# Patient Record
Sex: Male | Born: 1980 | Race: White | Hispanic: No | Marital: Single | Smoking: Former smoker
Health system: Southern US, Community
[De-identification: ages and names within clinical notes are randomized; demographics above are authoritative.]

## PROBLEM LIST (undated history)

## (undated) DIAGNOSIS — F419 Anxiety disorder, unspecified: Secondary | ICD-10-CM

## (undated) DIAGNOSIS — F32A Depression, unspecified: Secondary | ICD-10-CM

## (undated) DIAGNOSIS — F329 Major depressive disorder, single episode, unspecified: Secondary | ICD-10-CM

## (undated) DIAGNOSIS — K219 Gastro-esophageal reflux disease without esophagitis: Secondary | ICD-10-CM

## (undated) HISTORY — DX: Major depressive disorder, single episode, unspecified: F32.9

## (undated) HISTORY — DX: Gastro-esophageal reflux disease without esophagitis: K21.9

## (undated) HISTORY — DX: Anxiety disorder, unspecified: F41.9

## (undated) HISTORY — PX: WRIST SURGERY: SHX841

## (undated) HISTORY — DX: Depression, unspecified: F32.A

---
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
## 2002-09-28 ENCOUNTER — Emergency Department (HOSPITAL_COMMUNITY): Admission: EM | Admit: 2002-09-28 | Discharge: 2002-09-28 | Payer: Self-pay | Admitting: Emergency Medicine

## 2002-12-15 ENCOUNTER — Encounter: Payer: Self-pay | Admitting: Emergency Medicine

## 2002-12-15 ENCOUNTER — Emergency Department (HOSPITAL_COMMUNITY): Admission: EM | Admit: 2002-12-15 | Discharge: 2002-12-15 | Payer: Self-pay | Admitting: Emergency Medicine

## 2004-01-19 ENCOUNTER — Emergency Department (HOSPITAL_COMMUNITY): Admission: AC | Admit: 2004-01-19 | Discharge: 2004-01-19 | Payer: Self-pay

## 2005-10-25 ENCOUNTER — Emergency Department (HOSPITAL_COMMUNITY): Admission: EM | Admit: 2005-10-25 | Discharge: 2005-10-25 | Payer: Self-pay | Admitting: Emergency Medicine

## 2008-02-02 ENCOUNTER — Emergency Department (HOSPITAL_COMMUNITY): Admission: EM | Admit: 2008-02-02 | Discharge: 2008-02-02 | Payer: Self-pay | Admitting: Emergency Medicine

## 2008-11-12 ENCOUNTER — Emergency Department (HOSPITAL_COMMUNITY): Admission: EM | Admit: 2008-11-12 | Discharge: 2008-11-12 | Payer: Self-pay | Admitting: Emergency Medicine

## 2010-03-07 ENCOUNTER — Emergency Department: Payer: Self-pay | Admitting: Emergency Medicine

## 2011-04-12 ENCOUNTER — Other Ambulatory Visit (HOSPITAL_COMMUNITY): Payer: Self-pay | Admitting: Orthopedic Surgery

## 2011-04-12 DIAGNOSIS — M545 Low back pain, unspecified: Secondary | ICD-10-CM

## 2011-04-13 ENCOUNTER — Other Ambulatory Visit (HOSPITAL_COMMUNITY): Payer: Self-pay | Admitting: Orthopedic Surgery

## 2011-04-13 ENCOUNTER — Ambulatory Visit (HOSPITAL_COMMUNITY)
Admission: RE | Admit: 2011-04-13 | Discharge: 2011-04-13 | Disposition: A | Discharge status: Home / Self Care | Payer: Self-pay | Source: Ambulatory Visit | Attending: Orthopedic Surgery | Admitting: Orthopedic Surgery

## 2011-04-13 DIAGNOSIS — M5146 Schmorl's nodes, lumbar region: Secondary | ICD-10-CM | POA: Insufficient documentation

## 2011-04-13 DIAGNOSIS — M545 Low back pain, unspecified: Secondary | ICD-10-CM | POA: Insufficient documentation

## 2011-04-13 DIAGNOSIS — M79609 Pain in unspecified limb: Secondary | ICD-10-CM | POA: Insufficient documentation

## 2011-04-13 DIAGNOSIS — M519 Unspecified thoracic, thoracolumbar and lumbosacral intervertebral disc disorder: Secondary | ICD-10-CM | POA: Insufficient documentation

## 2011-04-13 DIAGNOSIS — R209 Unspecified disturbances of skin sensation: Secondary | ICD-10-CM | POA: Insufficient documentation

## 2011-05-10 ENCOUNTER — Other Ambulatory Visit (HOSPITAL_COMMUNITY): Payer: Self-pay | Admitting: Orthopedic Surgery

## 2011-05-15 ENCOUNTER — Ambulatory Visit (HOSPITAL_COMMUNITY)
Admission: RE | Admit: 2011-05-15 | Discharge: 2011-05-15 | Disposition: A | Discharge status: Home / Self Care | Payer: Self-pay | Source: Ambulatory Visit | Attending: Orthopedic Surgery | Admitting: Orthopedic Surgery

## 2011-05-15 DIAGNOSIS — R609 Edema, unspecified: Secondary | ICD-10-CM | POA: Insufficient documentation

## 2011-05-15 DIAGNOSIS — R209 Unspecified disturbances of skin sensation: Secondary | ICD-10-CM | POA: Insufficient documentation

## 2011-05-15 DIAGNOSIS — M79609 Pain in unspecified limb: Secondary | ICD-10-CM | POA: Insufficient documentation

## 2011-05-15 MED ORDER — GADOBENATE DIMEGLUMINE 529 MG/ML IV SOLN
15.0000 mL | Freq: Once | INTRAVENOUS | Status: AC
  Administered 2011-05-15: 15 mL via INTRAVENOUS

## 2011-09-26 LAB — POCT I-STAT, CHEM 8
Chloride: 105
Creatinine, Ser: 1.3
Glucose, Bld: 127 — ABNORMAL HIGH
HCT: 48
Sodium: 143
TCO2: 26

## 2011-09-26 LAB — ETHANOL: Alcohol, Ethyl (B): 205 — ABNORMAL HIGH

## 2014-12-30 ENCOUNTER — Emergency Department (HOSPITAL_COMMUNITY): Payer: 59

## 2014-12-30 ENCOUNTER — Encounter (HOSPITAL_COMMUNITY): Payer: Self-pay | Admitting: Emergency Medicine

## 2014-12-30 ENCOUNTER — Emergency Department (HOSPITAL_COMMUNITY)
Admission: EM | Admit: 2014-12-30 | Discharge: 2014-12-31 | Disposition: A | Discharge status: Home / Self Care | Payer: 59 | Attending: Emergency Medicine | Admitting: Emergency Medicine

## 2014-12-30 DIAGNOSIS — R05 Cough: Secondary | ICD-10-CM | POA: Diagnosis present

## 2014-12-30 DIAGNOSIS — J069 Acute upper respiratory infection, unspecified: Secondary | ICD-10-CM | POA: Insufficient documentation

## 2014-12-30 DIAGNOSIS — R059 Cough, unspecified: Secondary | ICD-10-CM

## 2014-12-30 DIAGNOSIS — R058 Other specified cough: Secondary | ICD-10-CM

## 2014-12-30 NOTE — ED Provider Notes (Signed)
CSN: [id number]     Arrival date & time 12/30/14  2225 History   First MD Initiated Contact with Patient 12/30/14 2314     Chief Complaint  Patient presents with  . Cough  . Nasal Congestion     (Consider location/radiation/quality/duration/timing/severity/associated sxs/prior Treatment) HPI  34 year old male who presents with URI symptoms. Patient states for the past 4 days he developed onset of nasal congestion runny nose, nonproductive cough and postnasal drips. He was seen at urgent care today for the same complaint and was prescribed antibiotic. He went home and report coughing up some blood streak fleghm.  The blood concerns him, prompting him to come to the ER for further evaluation. She states that he felt the blood was, from his nasal passage and does not think that he coughed up. States that his cough has been nonproductive . No report of fever or chills. No prior history of PE DVT, no recent surgery, prolonged bed rest, unilateral leg swelling. Patient is nonsmoker. No chest pain or shortness of breath.      History reviewed. No pertinent past medical history. Past Surgical History  Procedure Laterality Date  . Wrist surgery     No family history on file. History  Substance Use Topics  . Smoking status: Never Smoker   . Smokeless tobacco: Not on file  . Alcohol Use: Yes    Review of Systems  All other systems reviewed and are negative.     Allergies  Review of patient's allergies indicates no known allergies.  Home Medications   Prior to Admission medications   Not on File   BP 127/83 mmHg  Pulse 78  Temp(Src) 98.4 F (36.9 C) (Oral)  Resp 17  Ht  (1.753 m)  Wt 150 lb (68.04 kg)  BMI 22.14 kg/m2  SpO2 98% Physical Exam  Constitutional: He is oriented to person, place, and time. He appears well-developed and well-nourished. No distress.  HENT:  Head: Atraumatic.  Left TM is mildly erythematous right TM with normal cone of light and  clear  Nose: Left nares with trace of blood, primary is mildly erythematous  Throat: Uvula is midline, postnasal drip noted. No trismus. No tonsillar enlargement or exudates.  Eyes: Conjunctivae are normal.  Neck: Normal range of motion. Neck supple.  Cardiovascular: Normal rate and regular rhythm.   Pulmonary/Chest: Effort normal and breath sounds normal. He has no rales.  Abdominal: Soft. There is no tenderness.  Musculoskeletal: He exhibits no edema.  Lymphadenopathy:    He has no cervical adenopathy.  Neurological: He is alert and oriented to person, place, and time.  Skin: No rash noted.  Psychiatric: He has a normal mood and affect.    ED Course  Procedures (including critical care time)  Patient here with URI symptoms and postnasal drips with trace of blood likely coming from nasal passage. Chest x-ray without evidence of pneumonia. He has no significant risk factors for PE. Reassurance given. Recommend avoid taking antibiotic as this is likely to be a viral infection.  Labs Review Labs Reviewed - No data to display  Imaging Review Dg Chest 2 View  12/30/2014   CLINICAL DATA:  Acute onset of dry cough with blood in phlegm. Initial encounter.  EXAM: CHEST  2 VIEW  COMPARISON:  Chest radiograph performed 11/12/2008  FINDINGS: The lungs are well-aerated and clear. There is no evidence of focal opacification, pleural effusion or pneumothorax.  The heart is borderline normal in size; the mediastinal contour is within  normal limits. No acute osseous abnormalities are seen.  IMPRESSION: No acute cardiopulmonary process seen.   Electronically Signed   By: Roanna RaiderJeffery  Chang M.D.   On: 12/30/2014 23:46     EKG Interpretation None      MDM   Final diagnoses:  URI (upper respiratory infection)    BP 127/83 mmHg  Pulse 78  Temp(Src) 98.4 F (36.9 C) (Oral)  Resp 17  Ht 5\' 9"  (1.753 m)  Wt 150 lb (68.04 kg)  BMI 22.14 kg/m2  SpO2 98%  I have reviewed nursing notes and vital  signs. I personally reviewed the imaging tests through PACS system  I reviewed available ER/hospitalization records thought the EMR     Fayrene HelperBowie Adisyn Ruscitti, PA-C 12/31/14 0001  Richardean Canalavid H Yao, MD 12/31/14 [phone]

## 2014-12-30 NOTE — ED Notes (Addendum)
Pt. reports persistent productive cough with bloody phlegm , nasal congestion / runny nose/ post nasal drip  for several days seen  at an urgent care today , prescribed with oral antibiotic for URI .

## 2014-12-31 NOTE — Discharge Instructions (Signed)
Upper Respiratory Infection, Adult An upper respiratory infection (URI) is also sometimes known as the common cold. The upper respiratory tract includes the nose, sinuses, throat, trachea, and bronchi. Bronchi are the airways leading to the lungs. Most people improve within 1 week, but symptoms can last up to 2 weeks. A residual cough may last even longer.  CAUSES Many different viruses can infect the tissues lining the upper respiratory tract. The tissues become irritated and inflamed and often become very moist. Mucus production is also common. A cold is contagious. You can easily spread the virus to others by oral contact. This includes kissing, sharing a glass, coughing, or sneezing. Touching your mouth or nose and then touching a surface, which is then touched by another person, can also spread the virus. SYMPTOMS  Symptoms typically develop 1 to 3 days after you come in contact with a cold virus. Symptoms vary from person to person. They may include:  Runny nose.  Sneezing.  Nasal congestion.  Sinus irritation.  Sore throat.  Loss of voice (laryngitis).  Cough.  Fatigue.  Muscle aches.  Loss of appetite.  Headache.  Low-grade fever. DIAGNOSIS  You might diagnose your own cold based on familiar symptoms, since most people get a cold 2 to 3 times a year. Your caregiver can confirm this based on your exam. Most importantly, your caregiver can check that your symptoms are not due to another disease such as strep throat, sinusitis, pneumonia, asthma, or epiglottitis. Blood tests, throat tests, and X-rays are not necessary to diagnose a common cold, but they may sometimes be helpful in excluding other more serious diseases. Your caregiver will decide if any further tests are required. RISKS AND COMPLICATIONS  You may be at risk for a more severe case of the common cold if you smoke cigarettes, have chronic heart disease (such as heart failure) or lung disease (such as asthma), or if  you have a weakened immune system. The very young and very old are also at risk for more serious infections. Bacterial sinusitis, middle ear infections, and bacterial pneumonia can complicate the common cold. The common cold can worsen asthma and chronic obstructive pulmonary disease (COPD). Sometimes, these complications can require emergency medical care and may be life-threatening. PREVENTION  The best way to protect against getting a cold is to practice good hygiene. Avoid oral or hand contact with people with cold symptoms. Wash your hands often if contact occurs. There is no clear evidence that vitamin C, vitamin E, echinacea, or exercise reduces the chance of developing a cold. However, it is always recommended to get plenty of rest and practice good nutrition. TREATMENT  Treatment is directed at relieving symptoms. There is no cure. Antibiotics are not effective, because the infection is caused by a virus, not by bacteria. Treatment may include:  Increased fluid intake. Sports drinks offer valuable electrolytes, sugars, and fluids.  Breathing heated mist or steam (vaporizer or shower).  Eating chicken soup or other clear broths, and maintaining good nutrition.  Getting plenty of rest.  Using gargles or lozenges for comfort.  Controlling fevers with ibuprofen or acetaminophen as directed by your caregiver.  Increasing usage of your inhaler if you have asthma. Zinc gel and zinc lozenges, taken in the first 24 hours of the common cold, can shorten the duration and lessen the severity of symptoms. Pain medicines may help with fever, muscle aches, and throat pain. A variety of non-prescription medicines are available to treat congestion and runny nose. Your caregiver   can make recommendations and may suggest nasal or lung inhalers for other symptoms.  HOME CARE INSTRUCTIONS   Only take over-the-counter or prescription medicines for pain, discomfort, or fever as directed by your  caregiver.  Use a warm mist humidifier or inhale steam from a shower to increase air moisture. This may keep secretions moist and make it easier to breathe.  Drink enough water and fluids to keep your urine clear or pale yellow.  Rest as needed.  Return to work when your temperature has returned to normal or as your caregiver advises. You may need to stay home longer to avoid infecting others. You can also use a face mask and careful hand washing to prevent spread of the virus. SEEK MEDICAL CARE IF:   After the first few days, you feel you are getting worse rather than better.  You need your caregiver's advice about medicines to control symptoms.  You develop chills, worsening shortness of breath, or brown or red sputum. These may be signs of pneumonia.  You develop yellow or brown nasal discharge or pain in the face, especially when you bend forward. These may be signs of sinusitis.  You develop a fever, swollen neck glands, pain with swallowing, or white areas in the back of your throat. These may be signs of strep throat. SEEK IMMEDIATE MEDICAL CARE IF:   You have a fever.  You develop severe or persistent headache, ear pain, sinus pain, or chest pain.  You develop wheezing, a prolonged cough, cough up blood, or have a change in your usual mucus (if you have chronic lung disease).  You develop sore muscles or a stiff neck. Document Released: 06/05/2001 Document Revised: 03/03/2012 Document Reviewed: 03/17/2014 ExitCare Patient Information 2015 [employer]. This information is not intended to replace advice given to you by your health care provider. Make sure you discuss any questions you have with your health care provider.  

## 2015-03-21 ENCOUNTER — Encounter: Payer: Self-pay | Admitting: Family Medicine

## 2015-03-21 ENCOUNTER — Ambulatory Visit (INDEPENDENT_AMBULATORY_CARE_PROVIDER_SITE_OTHER): Payer: 59 | Admitting: Family Medicine

## 2015-03-21 VITALS — BP 112/70 | HR 76 | Temp 98.4°F | Resp 16 | Ht 66.5 in | Wt 148.2 lb

## 2015-03-21 DIAGNOSIS — K219 Gastro-esophageal reflux disease without esophagitis: Secondary | ICD-10-CM | POA: Diagnosis not present

## 2015-03-21 DIAGNOSIS — Z23 Encounter for immunization: Secondary | ICD-10-CM

## 2015-03-21 DIAGNOSIS — G4700 Insomnia, unspecified: Secondary | ICD-10-CM

## 2015-03-21 DIAGNOSIS — F418 Other specified anxiety disorders: Secondary | ICD-10-CM | POA: Diagnosis not present

## 2015-03-21 DIAGNOSIS — F419 Anxiety disorder, unspecified: Principal | ICD-10-CM

## 2015-03-21 DIAGNOSIS — F329 Major depressive disorder, single episode, unspecified: Secondary | ICD-10-CM

## 2015-03-21 MED ORDER — TRAZODONE HCL 50 MG PO TABS: 50.0000 mg | ORAL_TABLET | Freq: Every evening | ORAL | Status: DC | PRN

## 2015-03-21 MED ORDER — SERTRALINE HCL 50 MG PO TABS: 50.0000 mg | ORAL_TABLET | Freq: Every day | ORAL | Status: DC

## 2015-03-21 NOTE — Progress Notes (Signed)
Subjective:    Patient ID: Robert Wang, male    DOB: [birth year], 34 y.o.   MRN: [id number]  03/21/2015  Establish Care; Anxiety; and Depression   HPI This 34 y.o. male presents to establish care.  Last physical:  never Colonoscopy:  never TDAP:  Within seven years. 2012. Influenza:  Never; one year old at home. Eye exam: 20 years ago. Dental exam:  Today. First time in 20 years.   Depression and anxiety:  Onset three years ago.  Not doing well enough. Work is high stress.  Wants to scream at every single customer.  Trying to keep calm is really difficult.  Not enough money.  Girlfriend will ask for pizza; not enough money for pizza.  Having difficulties with dog food and formula.  Girlfriend does not work.  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  Lots of phone calls during the day.  Has blocked out windows.  No SI/HI.  No thoughts of hurting child.  No previous treatment for anxiety or depression.  No diagnosed or treated depression and anxiety.  Has tried Nyquil with cold; tried PM medications but does not want to be dependent upon medications.  No ZQuil.  Does not want anything to do with Xanax; has friend on Valium that worked great for him.  Goes to bed at 10:00am; wakes up at 2:00pm to feed son.  Falls back asleep 3:30pm until 9:30.    Headaches and teeth pain: takes Ibuprofen PRN.  Having teeth extraction next month.  [place] in HS.  GERD: taking Zantac  bid.  Denies n/v/d/c; denies abdominal pain.  Denies bloody stools or melena.    Review of Systems  Constitutional: Negative for fever, chills, diaphoresis, activity change, appetite change and fatigue.  HENT: Positive for dental problem.   Eyes: Negative for visual disturbance.  Respiratory: Negative for cough and shortness of breath.   Cardiovascular: Negative for chest pain, palpitations and leg swelling.  Gastrointestinal: Negative for nausea, vomiting, abdominal pain, diarrhea, constipation, blood in  stool and rectal pain.  Endocrine: Negative for cold intolerance, heat intolerance, polydipsia, polyphagia and polyuria.  Neurological: Negative for dizziness, tremors, seizures, syncope, facial asymmetry, speech difficulty, weakness, light-headedness, numbness and headaches.  Psychiatric/Behavioral: Positive for sleep disturbance and dysphoric mood. Negative for suicidal ideas and self-injury. The patient is nervous/anxious.     History reviewed. No pertinent past medical history. Past Surgical History  Procedure Laterality Date  . Wrist surgery      34 YEARS OLD   No Known Allergies Current Outpatient Prescriptions  Medication Sig Dispense Refill  . IBUPROFEN PO Take by mouth daily.    . Ranitidine HCl (ZANTAC PO) Take by mouth daily.    . sertraline (ZOLOFT) 50 MG tablet Take 1 tablet (50 mg total) by mouth daily. 30 tablet 3  . traZODone (DESYREL) 50 MG tablet Take 1-2 tablets (50-100 mg total) by mouth at bedtime as needed for sleep. 60 tablet 5   No current facility-administered medications for this visit.   History   Social History  . Marital Status: Single    Spouse Name: N/A  . Number of Children: N/A  . Years of Education: N/A   Occupational History  . [employer]    Social History Main Topics  . Smoking status: Former Smoker -- 1.50 packs/day for 15 years    Types: Cigarettes  . Smokeless tobacco: Not on file  . Alcohol Use: 0.0 oz/week    0 Standard drinks or equivalent  per week     Comment: beer - one or twice a month  . Drug Use: No  . Sexual Activity: Not on file   Other Topics Concern  . 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      Tobacco: Vaping      Alcohol:  0-1 drinks per week.      Drugs:  None      Exercise: none   Family History  Problem Relation Age of Onset  . Heart disease Mother 30     HEART ATTACK; CABG age 87.  Marland Kitchen Hypertension Father         Objective:    BP 112/70 mmHg  Pulse 76  Temp(Src) 98.4 F (36.9 C) (Oral)  Resp 16  Ht 5' 6.5" (1.689 m)  Wt 148 lb 3.2 oz (67.223 kg)  BMI 23.56 kg/m2  SpO2 98% Physical Exam  Constitutional: He is oriented to person, place, and time. He appears well-developed and well-nourished. No distress.  HENT:  Head: Normocephalic and atraumatic.  Right Ear: External ear normal.  Left Ear: External ear normal.  Nose: Nose normal.  Mouth/Throat: Oropharynx is clear and moist. Dental caries present.  Eyes: Conjunctivae and EOM are normal. Pupils are equal, round, and reactive to light.  Neck: Normal range of motion. Neck supple. Carotid bruit is not present. No thyromegaly present.  Cardiovascular: Normal rate, regular rhythm, normal heart sounds and intact distal pulses.  Exam reveals no gallop and no friction rub.   No murmur heard. Pulmonary/Chest: Effort normal and breath sounds normal. He has no wheezes. He has no rales.  Abdominal: Soft. Bowel sounds are normal. He exhibits no distension and no mass. There is no tenderness. There is no rebound and no guarding.  Lymphadenopathy:    He has no cervical adenopathy.  Neurological: He is alert and oriented to person, place, and time. No cranial nerve deficit. He exhibits normal muscle tone. Coordination normal.  Skin: Skin is warm and dry. No rash noted. He is not diaphoretic.  Psychiatric: He has a normal mood and affect. His behavior is normal. Judgment and thought content normal.  Nursing note and vitals reviewed.   INFLUENZA VACCINE ADMINISTERED.    Assessment & Plan:   1. Anxiety and depression   2. Insomnia   3. Gastroesophageal reflux disease without esophagitis   4. Need for prophylactic vaccination and inoculation against influenza     1. Anxiety and depression: New.  Secondary to work and personal stressors; discussed treatment options in detail with patient.   Declined psychotherapy at this time; not interested in starting exercise program. Rx for Zoloft  one tablet daily provided.  Limit caffeine intake. 2.  Insomnia:  New.  Rx for Trazodone  provided.  Recommend dedicating 6 hours minimum to sleep without having responsibility for child.   3. GERD: controlled with Zantac. 4.  S/p flu vaccine.   Meds ordered this encounter  Medications  . Ranitidine HCl (ZANTAC PO)    Sig: Take by mouth daily.  . IBUPROFEN PO    Sig: Take by mouth daily.  . sertraline (ZOLOFT) 50 MG tablet    Sig: Take 1 tablet (50 mg total) by mouth daily.    Dispense:  30 tablet  Refill:  3  . traZODone (DESYREL) 50 MG tablet    Sig: Take 1-2 tablets (50-100 mg total) by mouth at bedtime as needed for sleep.    Dispense:  60 tablet    Refill:  5    Return in about 6 weeks (around 05/02/2015) for recheck anxiety and insomnia.    Nisa Decaire Paulita FujitaMartin Olander Friedl, M.D. [facility]  [place] [address] [place], [place]  [id number] [phone] phone [phone] fax

## 2015-03-21 NOTE — Patient Instructions (Signed)
Generalized Anxiety Disorder Generalized anxiety disorder (GAD) is a mental disorder. It interferes with life functions, including relationships, work, and school. GAD is different from normal anxiety, which everyone experiences at some point in their lives in response to specific life events and activities. Normal anxiety actually helps us prepare for and get through these life events and activities. Normal anxiety goes away after the event or activity is over.  GAD causes anxiety that is not necessarily related to specific events or activities. It also causes excess anxiety in proportion to specific events or activities. The anxiety associated with GAD is also difficult to control. GAD can vary from mild to severe. People with severe GAD can have intense waves of anxiety with physical symptoms (panic attacks).  SYMPTOMS The anxiety and worry associated with GAD are difficult to control. This anxiety and worry are related to many life events and activities and also occur more days than not for 6 months or longer. People with GAD also have three or more of the following symptoms (one or more in children):  Restlessness.   Fatigue.  Difficulty concentrating.   Irritability.  Muscle tension.  Difficulty sleeping or unsatisfying sleep. DIAGNOSIS GAD is diagnosed through an assessment by your health care provider. Your health care provider will ask you questions aboutyour mood,physical symptoms, and events in your life. Your health care provider may ask you about your medical history and use of alcohol or drugs, including prescription medicines. Your health care provider may also do a physical exam and blood tests. Certain medical conditions and the use of certain substances can cause symptoms similar to those associated with GAD. Your health care provider may refer you to a mental health specialist for further evaluation. TREATMENT The following therapies are usually used to treat GAD:    Medication. Antidepressant medication usually is prescribed for long-term daily control. Antianxiety medicines may be added in severe cases, especially when panic attacks occur.   Talk therapy (psychotherapy). Certain types of talk therapy can be helpful in treating GAD by providing support, education, and guidance. A form of talk therapy called cognitive behavioral therapy can teach you healthy ways to think about and react to daily life events and activities.  Stress managementtechniques. These include yoga, meditation, and exercise and can be very helpful when they are practiced regularly. A mental health specialist can help determine which treatment is best for you. Some people see improvement with one therapy. However, other people require a combination of therapies. Document Released: 04/06/2013 Document Revised: 04/26/2014 Document Reviewed: 04/06/2013 ExitCare Patient Information 2015 [employer]. This information is not intended to replace advice given to you by your health care provider. Make sure you discuss any questions you have with your health care provider.  

## 2015-03-25 ENCOUNTER — Telehealth: Payer: Self-pay

## 2015-03-25 NOTE — Telephone Encounter (Signed)
Call --- frequently side effects will improve within 2 weeks of taking medication. Are side effects tolerable; can he stick it out for two weeks?  Is he taking 1/2 tablet daily at this time?

## 2015-03-25 NOTE — Telephone Encounter (Signed)
Pt was put on sertraline (ZOLOFT) 50 MG tablet [[id number]][[id number]] and traZODone (DESYREL) 50 MG tablet [[id number]][[id number]]  By Dr. Katrinka BlazingSmith. Patient states that he is having sexual and sinus side affects, and would like to know if Dr. Katrinka BlazingSmith could adjust these medications for him.

## 2015-03-25 NOTE — Telephone Encounter (Signed)
Spoke with pt, he is taking the half pill of Zoloft. He also states he cannot make a bowel movement. He feels uncomfortable during the day and he states he cannot take this medication any longer because not having an orgasm is a deal breaker.for him. He thinks the Trazadone is messing with his sinuses. Can we switch him to something else. Pt does not want to try for 2 weeks. Please advise.

## 2015-03-25 NOTE — Telephone Encounter (Signed)
Dr. Katrinka BlazingSmith, should pt come in to discuss?

## 2015-03-25 NOTE — Telephone Encounter (Signed)
Left message for pt to call back  °

## 2015-03-26 MED ORDER — CITALOPRAM HYDROBROMIDE 20 MG PO TABS: 20.0000 mg | ORAL_TABLET | Freq: Every day | ORAL | Status: DC

## 2015-03-26 NOTE — Telephone Encounter (Signed)
Gave pt message.

## 2015-03-26 NOTE — Telephone Encounter (Signed)
Call---1. OK to stop Sertraline/Zoloft and Trazodone at this time.  2.  I have sent in Citalopram/Celexa 20mg  one tablet daily; this is for anxiety.  3.  Recommend he take Benadryl 1-2 tablets at bedtime.

## 2015-04-07 ENCOUNTER — Telehealth: Payer: Self-pay

## 2015-04-07 NOTE — Telephone Encounter (Signed)
Patient wants to speak with a Doctor about getting a prescription for heart burn. Please call! [phone]

## 2015-04-07 NOTE — Telephone Encounter (Signed)
Pt is on Zantac, maybe this is not working anymore.

## 2015-04-11 NOTE — Telephone Encounter (Signed)
Tried to call pt times 3 unable to leave voicemail.

## 2015-04-13 NOTE — Telephone Encounter (Signed)
Left message for pt to call back  °

## 2015-04-20 ENCOUNTER — Ambulatory Visit (INDEPENDENT_AMBULATORY_CARE_PROVIDER_SITE_OTHER): Payer: 59 | Admitting: Family Medicine

## 2015-04-20 ENCOUNTER — Encounter: Payer: Self-pay | Admitting: Family Medicine

## 2015-04-20 VITALS — BP 112/71 | HR 55 | Temp 98.2°F | Resp 16 | Ht 67.0 in | Wt 147.0 lb

## 2015-04-20 DIAGNOSIS — K219 Gastro-esophageal reflux disease without esophagitis: Secondary | ICD-10-CM | POA: Diagnosis not present

## 2015-04-20 DIAGNOSIS — F418 Other specified anxiety disorders: Secondary | ICD-10-CM | POA: Diagnosis not present

## 2015-04-20 DIAGNOSIS — F329 Major depressive disorder, single episode, unspecified: Secondary | ICD-10-CM

## 2015-04-20 DIAGNOSIS — G4700 Insomnia, unspecified: Secondary | ICD-10-CM

## 2015-04-20 DIAGNOSIS — F419 Anxiety disorder, unspecified: Principal | ICD-10-CM

## 2015-04-20 LAB — CBC WITH DIFFERENTIAL/PLATELET
Basophils Absolute: 0 10*3/uL (ref 0.0–0.1)
Basophils Relative: 0 % (ref 0–1)
EOS ABS: 0.1 10*3/uL (ref 0.0–0.7)
EOS PCT: 1 % (ref 0–5)
HEMATOCRIT: 41.7 % (ref 39.0–52.0)
Hemoglobin: 14.6 g/dL (ref 13.0–17.0)
LYMPHS PCT: 36 % (ref 12–46)
Lymphs Abs: 2.5 10*3/uL (ref 0.7–4.0)
MCH: 29.9 pg (ref 26.0–34.0)
MCHC: 35 g/dL (ref 30.0–36.0)
MCV: 85.5 fL (ref 78.0–100.0)
MONO ABS: 0.6 10*3/uL (ref 0.1–1.0)
MONOS PCT: 8 % (ref 3–12)
MPV: 10.1 fL (ref 8.6–12.4)
NEUTROS ABS: 3.8 10*3/uL (ref 1.7–7.7)
Neutrophils Relative %: 55 % (ref 43–77)
Platelets: 276 10*3/uL (ref 150–400)
RBC: 4.88 MIL/uL (ref 4.22–5.81)
RDW: 13.3 % (ref 11.5–15.5)
WBC: 6.9 10*3/uL (ref 4.0–10.5)

## 2015-04-20 LAB — COMPREHENSIVE METABOLIC PANEL
ALBUMIN: 4.6 g/dL (ref 3.5–5.2)
ALK PHOS: 61 U/L (ref 39–117)
ALT: 122 U/L — AB (ref 0–53)
AST: 40 U/L — AB (ref 0–37)
BUN: 16 mg/dL (ref 6–23)
CO2: 29 mEq/L (ref 19–32)
Calcium: 9.8 mg/dL (ref 8.4–10.5)
Chloride: 103 mEq/L (ref 96–112)
Creat: 0.97 mg/dL (ref 0.50–1.35)
Glucose, Bld: 98 mg/dL (ref 70–99)
POTASSIUM: 4 meq/L (ref 3.5–5.3)
Sodium: 138 mEq/L (ref 135–145)
TOTAL PROTEIN: 7.3 g/dL (ref 6.0–8.3)
Total Bilirubin: 0.5 mg/dL (ref 0.2–1.2)

## 2015-04-20 LAB — TSH: TSH: 1.923 u[IU]/mL (ref 0.350–4.500)

## 2015-04-20 MED ORDER — TRAZODONE HCL 100 MG PO TABS: 100.0000 mg | ORAL_TABLET | Freq: Every evening | ORAL | Status: DC | PRN

## 2015-04-20 MED ORDER — PANTOPRAZOLE SODIUM 40 MG PO TBEC: 40.0000 mg | DELAYED_RELEASE_TABLET | Freq: Every day | ORAL | Status: DC

## 2015-04-20 MED ORDER — SERTRALINE HCL 100 MG PO TABS: 100.0000 mg | ORAL_TABLET | Freq: Every day | ORAL | Status: DC

## 2015-04-20 NOTE — Progress Notes (Signed)
Subjective:    Patient ID: Robert Wang, male    DOB: 04/27/1981, 34 y.o.   MRN: [id number]  04/20/2015  Heartburn; Medication Management; and Medication Refill   HPI This 34 y.o. male presents for four week follow-up of the following:  1. Anxiety and depression: management changes made at last visit included adding Zoloft 50mg  daily.  No improvement in mood.  Never stopped Zoloft; picked up Celexa but has not started it yet.  Zoloft side effects delayed orgasm every time; still can reach orgasm but difficult; no improvement since starting.  Other side effects is constipation which is less severe; tolerable.  Anxiety around people.  Feeling like people staring at them or laughing. Seems worse on medication.  Now has 2 cars.  Working more now.  Working 54 hours per week.  Was also taking hydrocodone when started Zoloft; now that off of hydrocodone.   Has only been a few days.   No hallucinations.  2. Insomnia:  Going to bed 10:00am; taking Trazodone at 9:30am; taking 2 Trazodone; taking one hour to take effect; sleeping after 4.5-6 hours.    3. GERD:  Got worse after last visit; had to keep Zantac with patient at all times; if missed dose, symptoms were horrible.  No n/v; no abdominal pain.  Indigestion.  Weird aches in shoulder blade.  None in two weeks; was having weird pains in shoulder blade for days.   Drinking one beer per week.  No bowel movement today.   Review of Systems  Constitutional: Negative for fever, chills, diaphoresis, activity change, appetite change and fatigue.  Eyes: Negative for visual disturbance.  Respiratory: Negative for cough and shortness of breath.   Cardiovascular: Negative for chest pain, palpitations and leg swelling.  Gastrointestinal: Positive for constipation. Negative for nausea, vomiting, abdominal pain, diarrhea, blood in stool, abdominal distention, anal bleeding and rectal pain.  Endocrine: Negative for cold intolerance, heat intolerance,  polydipsia, polyphagia and polyuria.  Neurological: Negative for dizziness, tremors, seizures, syncope, facial asymmetry, speech difficulty, weakness, light-headedness, numbness and headaches.  Psychiatric/Behavioral: Positive for dysphoric mood. Negative for suicidal ideas, sleep disturbance and self-injury. The patient is nervous/anxious.     No past medical history on file. Past Surgical History  Procedure Laterality Date  . Wrist surgery      34 YEARS OLD   No Known Allergies History   Social History  . Marital Status: Single    Spouse Name: N/A  . Number of Children: N/A  . Years of Education: N/A   Occupational History  . [employer]    Social History Main Topics  . Smoking status: Former Smoker -- 1.50 packs/day for 15 years    Types: Cigarettes  . Smokeless tobacco: Not on file  . Alcohol Use: 0.0 oz/week    0 Standard drinks or equivalent per week     Comment: beer - one or twice a month  . Drug Use: No  . Sexual Activity: Not on file   Other Topics Concern  . 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      Tobacco: Vaping      Alcohol:  0-1 drinks per week.      Drugs:  None      Exercise: none  Family History  Problem Relation Age of Onset  . Heart disease Mother 71    HEART ATTACK; CABG age 54.  Marland Kitchen Hypertension Father         Objective:    BP 112/71 mmHg  Pulse 55  Temp(Src) 98.2 F (36.8 C)  Resp 16  Ht  (1.702 m)  Wt 147 lb (66.679 kg)  BMI 23.02 kg/m2  SpO2 98% Physical Exam  Constitutional: He is oriented to person, place, and time. He appears well-developed and well-nourished. No distress.  HENT:  Head: Normocephalic and atraumatic.  Right Ear: External ear normal.  Left Ear: External ear normal.  Nose: Nose normal.  Mouth/Throat: Oropharynx is clear and moist.  Poor dentition    Eyes: Conjunctivae and EOM are normal. Pupils are equal, round, and reactive to light.  Neck: Normal range of motion. Neck supple. Carotid bruit is not present. No thyromegaly present.  Cardiovascular: Normal rate, regular rhythm, normal heart sounds and intact distal pulses.  Exam reveals no gallop and no friction rub.   No murmur heard. Pulmonary/Chest: Effort normal and breath sounds normal. He has no wheezes. He has no rales.  Abdominal: Soft. Bowel sounds are normal. He exhibits no distension and no mass. There is no tenderness. There is no rebound and no guarding.  Lymphadenopathy:    He has no cervical adenopathy.  Neurological: He is alert and oriented to person, place, and time. No cranial nerve deficit.  Skin: Skin is warm and dry. No rash noted. He is not diaphoretic.  Psychiatric: He has a normal mood and affect. His behavior is normal. Judgment and thought content normal.  Nursing note and vitals reviewed.       Assessment & Plan:   1. Anxiety and depression   2. Insomnia   3. Gastroesophageal reflux disease without esophagitis     1. Anxiety and depression: unchanged; increase Zoloft to  daily.  If suffers with worsening side effects, switch to Celexa  daily.   2.  Insomnia: well controlled with Trazodone  before sleep.   3.  GERD: uncontrolled; obtain labs; rx for Protonix provided; continue Zantac  bid PRN.  Dietary modification encouraged and reviewed.   Meds ordered this encounter  Medications  . sertraline (ZOLOFT) 100 MG tablet    Sig: Take 1 tablet (100 mg total) by mouth daily.    Dispense:  30 tablet    Refill:  5  . traZODone (DESYREL) 100 MG tablet    Sig: Take 1 tablet (100 mg total) by mouth at bedtime as needed for sleep.    Dispense:  30 tablet    Refill:  5  . pantoprazole (PROTONIX) 40 MG tablet    Sig: Take 1 tablet (40 mg total) by mouth daily.    Dispense:  30 tablet    Refill:  5    Return in about 6 weeks (around  06/01/2015) for recheck.   Jahaan Vanwagner Paulita Fujita, M.D. [facility] [address] [place], [place]  [address] [phone] phone [phone] fax

## 2015-04-20 NOTE — Patient Instructions (Signed)

## 2015-04-21 ENCOUNTER — Encounter: Payer: Self-pay | Admitting: Family Medicine

## 2015-04-21 DIAGNOSIS — F419 Anxiety disorder, unspecified: Principal | ICD-10-CM

## 2015-04-21 DIAGNOSIS — F329 Major depressive disorder, single episode, unspecified: Secondary | ICD-10-CM | POA: Insufficient documentation

## 2015-04-21 DIAGNOSIS — G4700 Insomnia, unspecified: Secondary | ICD-10-CM | POA: Insufficient documentation

## 2015-04-21 DIAGNOSIS — K219 Gastro-esophageal reflux disease without esophagitis: Secondary | ICD-10-CM | POA: Insufficient documentation

## 2015-04-21 LAB — H. PYLORI BREATH TEST: H. PYLORI BREATH TEST: NOT DETECTED

## 2015-05-02 ENCOUNTER — Ambulatory Visit (INDEPENDENT_AMBULATORY_CARE_PROVIDER_SITE_OTHER): Payer: 59 | Admitting: Family Medicine

## 2015-05-02 ENCOUNTER — Encounter: Payer: Self-pay | Admitting: Family Medicine

## 2015-05-02 ENCOUNTER — Ambulatory Visit (INDEPENDENT_AMBULATORY_CARE_PROVIDER_SITE_OTHER): Payer: 59

## 2015-05-02 VITALS — BP 132/83 | HR 66 | Temp 97.9°F | Resp 16 | Ht 66.75 in | Wt 152.4 lb

## 2015-05-02 DIAGNOSIS — R7989 Other specified abnormal findings of blood chemistry: Secondary | ICD-10-CM | POA: Diagnosis not present

## 2015-05-02 DIAGNOSIS — G4700 Insomnia, unspecified: Secondary | ICD-10-CM

## 2015-05-02 DIAGNOSIS — F419 Anxiety disorder, unspecified: Secondary | ICD-10-CM

## 2015-05-02 DIAGNOSIS — J069 Acute upper respiratory infection, unspecified: Secondary | ICD-10-CM | POA: Diagnosis not present

## 2015-05-02 DIAGNOSIS — M5410 Radiculopathy, site unspecified: Secondary | ICD-10-CM

## 2015-05-02 DIAGNOSIS — K625 Hemorrhage of anus and rectum: Secondary | ICD-10-CM | POA: Diagnosis not present

## 2015-05-02 DIAGNOSIS — K219 Gastro-esophageal reflux disease without esophagitis: Secondary | ICD-10-CM

## 2015-05-02 DIAGNOSIS — F418 Other specified anxiety disorders: Secondary | ICD-10-CM

## 2015-05-02 DIAGNOSIS — F329 Major depressive disorder, single episode, unspecified: Secondary | ICD-10-CM

## 2015-05-02 DIAGNOSIS — F32A Depression, unspecified: Secondary | ICD-10-CM

## 2015-05-02 DIAGNOSIS — R945 Abnormal results of liver function studies: Principal | ICD-10-CM

## 2015-05-02 MED ORDER — GABAPENTIN 100 MG PO CAPS: 100.0000 mg | ORAL_CAPSULE | Freq: Every day | ORAL | Status: DC

## 2015-05-02 MED ORDER — HYDROCORTISONE ACETATE 25 MG RE SUPP: 25.0000 mg | Freq: Two times a day (BID) | RECTAL | Status: DC

## 2015-05-02 MED ORDER — DM-GUAIFENESIN ER 30-600 MG PO TB12: 1.0000 | ORAL_TABLET | Freq: Two times a day (BID) | ORAL | Status: DC | PRN

## 2015-05-02 NOTE — Progress Notes (Signed)
[facility] reading (PRIMARY) by  Dr. Kamaile Zachow.  CXR: NAD   

## 2015-05-02 NOTE — Progress Notes (Signed)
Subjective:    Patient ID: Robert Wang, male    DOB: 02/12/1981, 34 y.o.   MRN: [id number]  HPI  Robert Wang is a 34 year old male who presents today for follow-up of anxiety and depression, along with complaint of  sore throat, congestion, and muscle pain.  His anxiety and depression seems to be improving with the increased dosage of Zoloft. He was seen on 04/20/15 and his dose was increased from 50 mg daily to 100 mg daily. Seems to think that depression is improved, as long as he's not out of money--when he runs out of money, he gets depressed.  He started taking Celexa for anxiety, but had diarrhea while taking the Celexa. For the 2 days that he was taking the Celexa, he had 7 bowel movements within a 7 hour shift at work. He also noticed rectal bleeding. He would only notice the bleeding on the toilet paper after wiping a bowel movement, denies any blood in his stool. Stopped taking the Celexa 5-6 days ago, but still has slight rectal bleeding. Has had internal hemorrhoids before, and thinks that he felt a mass protruding when he had a bowel movement.   His acid reflux has been very well-controlled on the Protonix. He has not had to take and Zantac, and has not noticed any heartburn since starting the Protonix.  Insomnia is still not greatly controlled. Taking Trazodone 100 mg for sleep, but once he wakes up, he can't fall back asleep, even if it's only been 2 hours of sleep. No matter what he does, his body doesn't want to go back to sleep. Increased Trazodone dose to two 100 mg pills yesterday, and he slept through the day and got a good 6 hours of sleep. Felt much better after getting that amount of sleep.  Right thigh pain: 5 years ago he woke up in middle of night after having a dream that his muscle was ripping in half. He woke up and it his right upper thigh was burning. Went to specialist who did an MRI of his back and gave him 6 months worth of pain medications. Did an  MRI of his leg, and discovered he had a torn muscle. He was frustrated that the specialist mis-diagnosed him and had him addicted to pain pills, so he stopped paying the doctor and has about $13,000 worth of fees. Started taking anti-inflammatory medications, but still doesn't seem to help. The localized area is still numb to this day, and he has daily flare-ups of burning sensation that lasts for 10-15 minutes. He denies numbness or tingling down his leg.    Has had a cough x 5-6 days, along with sore throat, shortness of breath when walking, congestion, and post-nasal drip. His cough has been mostly dry and nonproductive. Sore throat is constant, and is temporarily relieved with throat lozenges. Has been taking Alka Seltzer plus for 5 days to help him get through work. His most recent lab work on 04/20/15 indicated elevated AST and ALT, and he was told to avoid acetaminophen products. He has been using over the counter cold medications with acetaminophen because "everything for a cold has acetaminophen in it." His son was sick recently with flu-like symptoms and a fever of 104 degrees F.   Review of Systems  Constitutional: Negative for fever, chills, diaphoresis and fatigue.  HENT: Positive for congestion, postnasal drip, sinus pressure, sneezing, sore throat and trouble swallowing (Painful). Negative for ear discharge, hearing loss and rhinorrhea.  Eyes: Positive for itching. Negative for pain, redness and visual disturbance.  Respiratory: Positive for cough and shortness of breath (With activity).   Cardiovascular: Negative for chest pain, palpitations and leg swelling.  Gastrointestinal: Positive for anal bleeding. Negative for nausea, vomiting, diarrhea, constipation, blood in stool and rectal pain.  Genitourinary: Negative.   Musculoskeletal: Positive for myalgias (Right thigh burning).  Allergic/Immunologic: Negative for environmental allergies.  Neurological: Negative for dizziness,  light-headedness and headaches.  Psychiatric/Behavioral: Positive for sleep disturbance. Negative for suicidal ideas. The patient is nervous/anxious.        Objective:   Physical Exam  Constitutional: He is oriented to person, place, and time. He appears well-developed and well-nourished. No distress.  BP 132/83 mmHg  Pulse 66  Temp(Src) 97.9 F (36.6 C) (Oral)  Resp 16  Ht 5' 6.75" (1.695 m)  Wt 152 lb 6.4 oz (69.128 kg)  BMI 24.06 kg/m2  SpO2 99%  HENT:  Head: Normocephalic and atraumatic.  Right Ear: Hearing, tympanic membrane, external ear and ear canal normal.  Left Ear: Hearing, tympanic membrane, external ear and ear canal normal.  Nose: Right sinus exhibits maxillary sinus tenderness. Right sinus exhibits no frontal sinus tenderness. Left sinus exhibits maxillary sinus tenderness. Left sinus exhibits no frontal sinus tenderness.  Mouth/Throat: Uvula is midline, oropharynx is clear and moist and mucous membranes are normal. Abnormal dentition (Missing front tooth). No oropharyngeal exudate, posterior oropharyngeal edema or posterior oropharyngeal erythema.  Eyes: Conjunctivae are normal. Pupils are equal, round, and reactive to light. No scleral icterus.  Neck: Normal range of motion. Neck supple.  Cardiovascular: Normal rate, regular rhythm and normal heart sounds.  Exam reveals no gallop and no friction rub.   No murmur heard. Pulmonary/Chest: Effort normal and breath sounds normal. He has no wheezes. He has no rhonchi. He has no rales.  Abdominal: Soft. Bowel sounds are normal. There is tenderness (Diffuse).  Genitourinary: Rectum normal. Rectal exam shows no external hemorrhoid.  Musculoskeletal: Normal range of motion.       Legs: Lymphadenopathy:       Head (right side): No submental, no submandibular, no tonsillar, no preauricular, no posterior auricular and no occipital adenopathy present.       Head (left side): No submental, no submandibular, no tonsillar, no  preauricular, no posterior auricular and no occipital adenopathy present.    He has no cervical adenopathy.       Right: No supraclavicular adenopathy present.       Left: No supraclavicular adenopathy present.  Neurological: He is alert and oriented to person, place, and time. He has normal strength and normal reflexes.  Reflex Scores:      Patellar reflexes are 2+ on the right side and 2+ on the left side.      Achilles reflexes are 2+ on the right side and 2+ on the left side. Skin: Skin is warm and dry. No rash noted. No erythema.  Psychiatric: His speech is normal and behavior is normal. His mood appears anxious.   Chest x-ray primary reading with Dr. Katrinka BlazingSmith: no acute findings; clear costophrenic angles, no infiltrates noted, clear cardiac silhouette.      Assessment & Plan:  1. Elevated LFTs Recheck levels from previous visit. AST and ALT were elevated on 04/20/15 at 40 and 122, respectively. Advised him to continue to avoid (if possible) acetaminophen and alcohol. - Comprehensive metabolic panel  2. URI (upper respiratory infection) Likely infection from child. He has shortness of breath while walking, did a  chest x-ray to make sure it wasn't a pneumonia developing. Gave prescription for Mucinex DM to help with the cough and congestion. - CBC with Differential/Platelet - DG Chest 2 View; Future  3. Rectal bleeding Prescribed Hydrocortizone 25 mg suppository for rectal bleeding. If this is hemorrhoids, the suppositories (if used appropriately for 2 weeks) should decrease the bleeding. If bleeding persists, will need to refer to GI for further testing.  4. Insomnia Continue taking Trazodone as needed for sleep. Can increase to 200 mg daily before bed, but likely will not need to increase dose with adding the Gabapentin at night.  5. Gastroesophageal reflux disease without esophagitis Stable. Continue current regimen of Protonix 40 mg daily.  6. Anxiety and depression Improved.  Continue Zoloft 100 mg daily. Will reevaluate in 6 weeks and determine if we need to increase the dose of Zoloft or add something else. Discontinue Celexa due to side effects.  7. Radicular leg pain Discussed that since this pain has been ongoing for 5 years, it likely will result for the rest of his life. Offered a referral to physical therapy and he was going to think about it. Physical therapy would be great for the weakness and spasms he occasionally experiences. For now, will start on Gabapentin 100-200 mg daily at bedtime. We can increase the dose if it seems to help. Because he will be taking the Gabapentin, he may be able to decrease the Trazodone dose.

## 2015-05-03 LAB — CBC WITH DIFFERENTIAL/PLATELET
BASOS ABS: 0 10*3/uL (ref 0.0–0.1)
BASOS PCT: 0 % (ref 0–1)
EOS ABS: 0.1 10*3/uL (ref 0.0–0.7)
EOS PCT: 1 % (ref 0–5)
HCT: 39.5 % (ref 39.0–52.0)
Hemoglobin: 13.6 g/dL (ref 13.0–17.0)
Lymphocytes Relative: 27 % (ref 12–46)
Lymphs Abs: 2.2 10*3/uL (ref 0.7–4.0)
MCH: 29 pg (ref 26.0–34.0)
MCHC: 34.4 g/dL (ref 30.0–36.0)
MCV: 84.2 fL (ref 78.0–100.0)
MONO ABS: 0.6 10*3/uL (ref 0.1–1.0)
MPV: 10.2 fL (ref 8.6–12.4)
Monocytes Relative: 8 % (ref 3–12)
NEUTROS ABS: 5.1 10*3/uL (ref 1.7–7.7)
Neutrophils Relative %: 64 % (ref 43–77)
PLATELETS: 284 10*3/uL (ref 150–400)
RBC: 4.69 MIL/uL (ref 4.22–5.81)
RDW: 13.1 % (ref 11.5–15.5)
WBC: 8 10*3/uL (ref 4.0–10.5)

## 2015-05-03 LAB — COMPREHENSIVE METABOLIC PANEL
ALK PHOS: 86 U/L (ref 39–117)
ALT: 132 U/L — ABNORMAL HIGH (ref 0–53)
AST: 34 U/L (ref 0–37)
Albumin: 4.3 g/dL (ref 3.5–5.2)
BILIRUBIN TOTAL: 0.3 mg/dL (ref 0.2–1.2)
BUN: 13 mg/dL (ref 6–23)
CHLORIDE: 104 meq/L (ref 96–112)
CO2: 27 meq/L (ref 19–32)
Calcium: 9.4 mg/dL (ref 8.4–10.5)
Creat: 0.91 mg/dL (ref 0.50–1.35)
GLUCOSE: 82 mg/dL (ref 70–99)
POTASSIUM: 4 meq/L (ref 3.5–5.3)
SODIUM: 141 meq/L (ref 135–145)
TOTAL PROTEIN: 6.7 g/dL (ref 6.0–8.3)

## 2015-05-07 ENCOUNTER — Telehealth: Payer: Self-pay | Admitting: Family Medicine

## 2015-05-07 NOTE — Telephone Encounter (Signed)
lmom to call us to get information on his appt his appt was changed from 06/08/15 to 07/15/15 at 3:15

## 2015-06-08 ENCOUNTER — Ambulatory Visit: Payer: 59 | Admitting: Family Medicine

## 2015-07-15 ENCOUNTER — Encounter: Payer: 59 | Admitting: Family Medicine

## 2015-07-15 NOTE — Progress Notes (Deleted)
Subjective:    Patient ID: Robert Wang, male    DOB: 12-28-1980, 34 y.o.   MRN: [id number]  07/15/2015  No chief complaint on file.   HPI  Review of Systems  Constitutional: Negative for fever, chills, diaphoresis, activity change, appetite change and fatigue.  Respiratory: Negative for cough and shortness of breath.   Cardiovascular: Negative for chest pain, palpitations and leg swelling.  Gastrointestinal: Negative for nausea, vomiting, abdominal pain and diarrhea.  Endocrine: Negative for cold intolerance, heat intolerance, polydipsia, polyphagia and polyuria.  Skin: Negative for color change, rash and wound.  Neurological: Negative for dizziness, tremors, seizures, syncope, facial asymmetry, speech difficulty, weakness, light-headedness, numbness and headaches.  Psychiatric/Behavioral: Negative for sleep disturbance and dysphoric mood. The patient is not nervous/anxious.     Past Medical History  Diagnosis Date  . Anxiety   . Depression   . GERD (gastroesophageal reflux disease)    Past Surgical History  Procedure Laterality Date  . Wrist surgery      34 YEARS OLD   No Known Allergies Current Outpatient Prescriptions  Medication Sig Dispense Refill  . citalopram (CELEXA) 20 MG tablet Take 1 tablet (20 mg total) by mouth daily. (Patient not taking: Reported on 04/20/2015) 30 tablet 3  . dextromethorphan-guaiFENesin (MUCINEX DM) 30-600 MG per 12 hr tablet Take 1-2 tablets by mouth 2 (two) times daily as needed for cough. 30 tablet 0  . gabapentin (NEURONTIN) 100 MG capsule Take 1-2 capsules (100-200 mg total) by mouth at bedtime. 60 capsule 3  . hydrocortisone (ANUSOL-HC) 25 MG suppository Place 1 suppository (25 mg total) rectally 2 (two) times daily. 12 suppository 0  . IBUPROFEN PO Take by mouth daily.    . pantoprazole (PROTONIX) 40 MG tablet Take 1 tablet (40 mg total) by mouth daily. 30 tablet 5  . Ranitidine HCl (ZANTAC PO) Take by mouth daily.    .  sertraline (ZOLOFT) 100 MG tablet Take 1 tablet (100 mg total) by mouth daily. 30 tablet 5  . traZODone (DESYREL) 100 MG tablet Take 1 tablet (100 mg total) by mouth at bedtime as needed for sleep. 30 tablet 5   No current facility-administered medications for this visit.       Objective:    There were no vitals taken for this visit. Physical Exam  Constitutional: He is oriented to person, place, and time. He appears well-developed and well-nourished. No distress.  HENT:  Head: Normocephalic and atraumatic.  Right Ear: External ear normal.  Left Ear: External ear normal.  Nose: Nose normal.  Mouth/Throat: Oropharynx is clear and moist.  Eyes: Conjunctivae and EOM are normal. Pupils are equal, round, and reactive to light.  Neck: Normal range of motion. Neck supple. Carotid bruit is not present. No thyromegaly present.  Cardiovascular: Normal rate, regular rhythm, normal heart sounds and intact distal pulses.  Exam reveals no gallop and no friction rub.   No murmur heard. Pulmonary/Chest: Effort normal and breath sounds normal. He has no wheezes. He has no rales.  Abdominal: Soft. Bowel sounds are normal. He exhibits no distension and no mass. There is no tenderness. There is no rebound and no guarding.  Lymphadenopathy:    He has no cervical adenopathy.  Neurological: He is alert and oriented to person, place, and time. No cranial nerve deficit.  Skin: Skin is warm and dry. No rash noted. He is not diaphoretic.  Psychiatric: He has a normal mood and affect. His behavior is normal.  Nursing note  and vitals reviewed.  Results for orders placed or performed in visit on 05/02/15  Comprehensive metabolic panel  Result Value Ref Range   Sodium 141 135 - 145 mEq/L   Potassium 4.0 3.5 - 5.3 mEq/L   Chloride 104 96 - 112 mEq/L   CO2 27 19 - 32 mEq/L   Glucose, Bld 82 70 - 99 mg/dL   BUN 13 6 - 23 mg/dL   Creat 4.09 8.11 - 9.14 mg/dL   Total Bilirubin 0.3 0.2 - 1.2 mg/dL   Alkaline  Phosphatase 86 39 - 117 U/L   AST 34 0 - 37 U/L   ALT 132 (H) 0 - 53 U/L   Total Protein 6.7 6.0 - 8.3 g/dL   Albumin 4.3 3.5 - 5.2 g/dL   Calcium 9.4 8.4 - 78.2 mg/dL  CBC with Differential/Platelet  Result Value Ref Range   WBC 8.0 4.0 - 10.5 K/uL   RBC 4.69 4.22 - 5.81 MIL/uL   Hemoglobin 13.6 13.0 - 17.0 g/dL   HCT 95.6 21.3 - 08.6 %   MCV 84.2 78.0 - 100.0 fL   MCH 29.0 26.0 - 34.0 pg   MCHC 34.4 30.0 - 36.0 g/dL   RDW 57.8 46.9 - 62.9 %   Platelets 284 150 - 400 K/uL   MPV 10.2 8.6 - 12.4 fL   Neutrophils Relative % 64 43 - 77 %   Neutro Abs 5.1 1.7 - 7.7 K/uL   Lymphocytes Relative 27 12 - 46 %   Lymphs Abs 2.2 0.7 - 4.0 K/uL   Monocytes Relative 8 3 - 12 %   Monocytes Absolute 0.6 0.1 - 1.0 K/uL   Eosinophils Relative 1 0 - 5 %   Eosinophils Absolute 0.1 0.0 - 0.7 K/uL   Basophils Relative 0 0 - 1 %   Basophils Absolute 0.0 0.0 - 0.1 K/uL   Smear Review SEE NOTE        Assessment & Plan:   1. Insomnia   2. Gastroesophageal reflux disease without esophagitis   3. Anxiety and depression   4. Elevated LFTs   5. Neuropathy   6. Radicular leg pain   7. Rectal bleeding     No orders of the defined types were placed in this encounter.    No Follow-up on file.    Kashonda Sarkisyan Paulita Fujita, M.D. [facility] [address] [place], [place]  [address] [phone] phone [phone] fax

## 2015-07-15 NOTE — Progress Notes (Signed)
This encounter was created in error - please disregard.

## 2015-10-11 ENCOUNTER — Other Ambulatory Visit: Payer: Self-pay | Admitting: Family Medicine

## 2015-11-07 ENCOUNTER — Other Ambulatory Visit: Payer: Self-pay | Admitting: Family Medicine

## 2015-12-10 ENCOUNTER — Other Ambulatory Visit: Payer: Self-pay | Admitting: Family Medicine

## 2015-12-24 ENCOUNTER — Other Ambulatory Visit: Payer: Self-pay | Admitting: Family Medicine

## 2016-05-07 IMAGING — DX DG CHEST 2V
2 series · 2 of 2 positions shown · non-contrast
Comparison: Chest radiograph performed 11/12/2008

CLINICAL DATA: Acute onset of dry cough with blood in phlegm.
Initial encounter.

EXAM:
CHEST  2 VIEW

[chest pa]
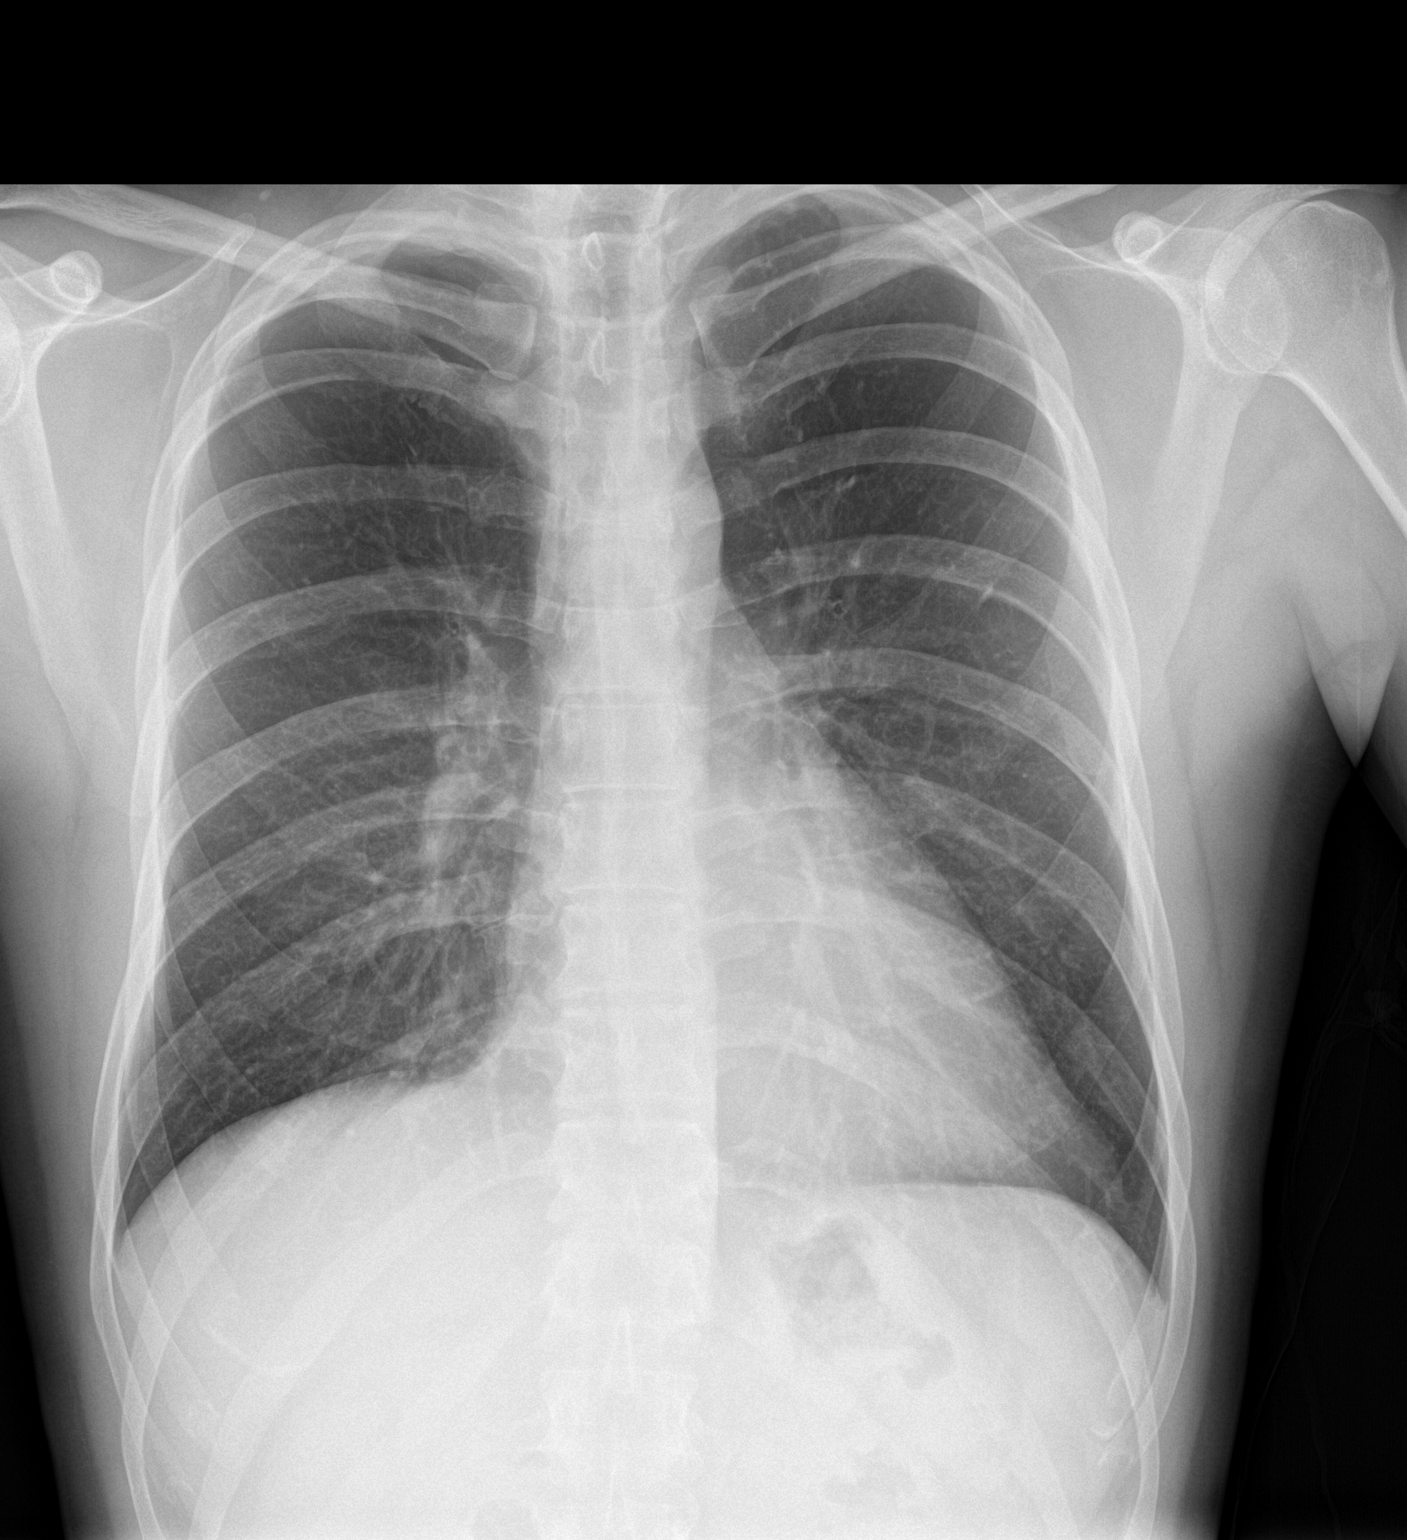

[chest lat]
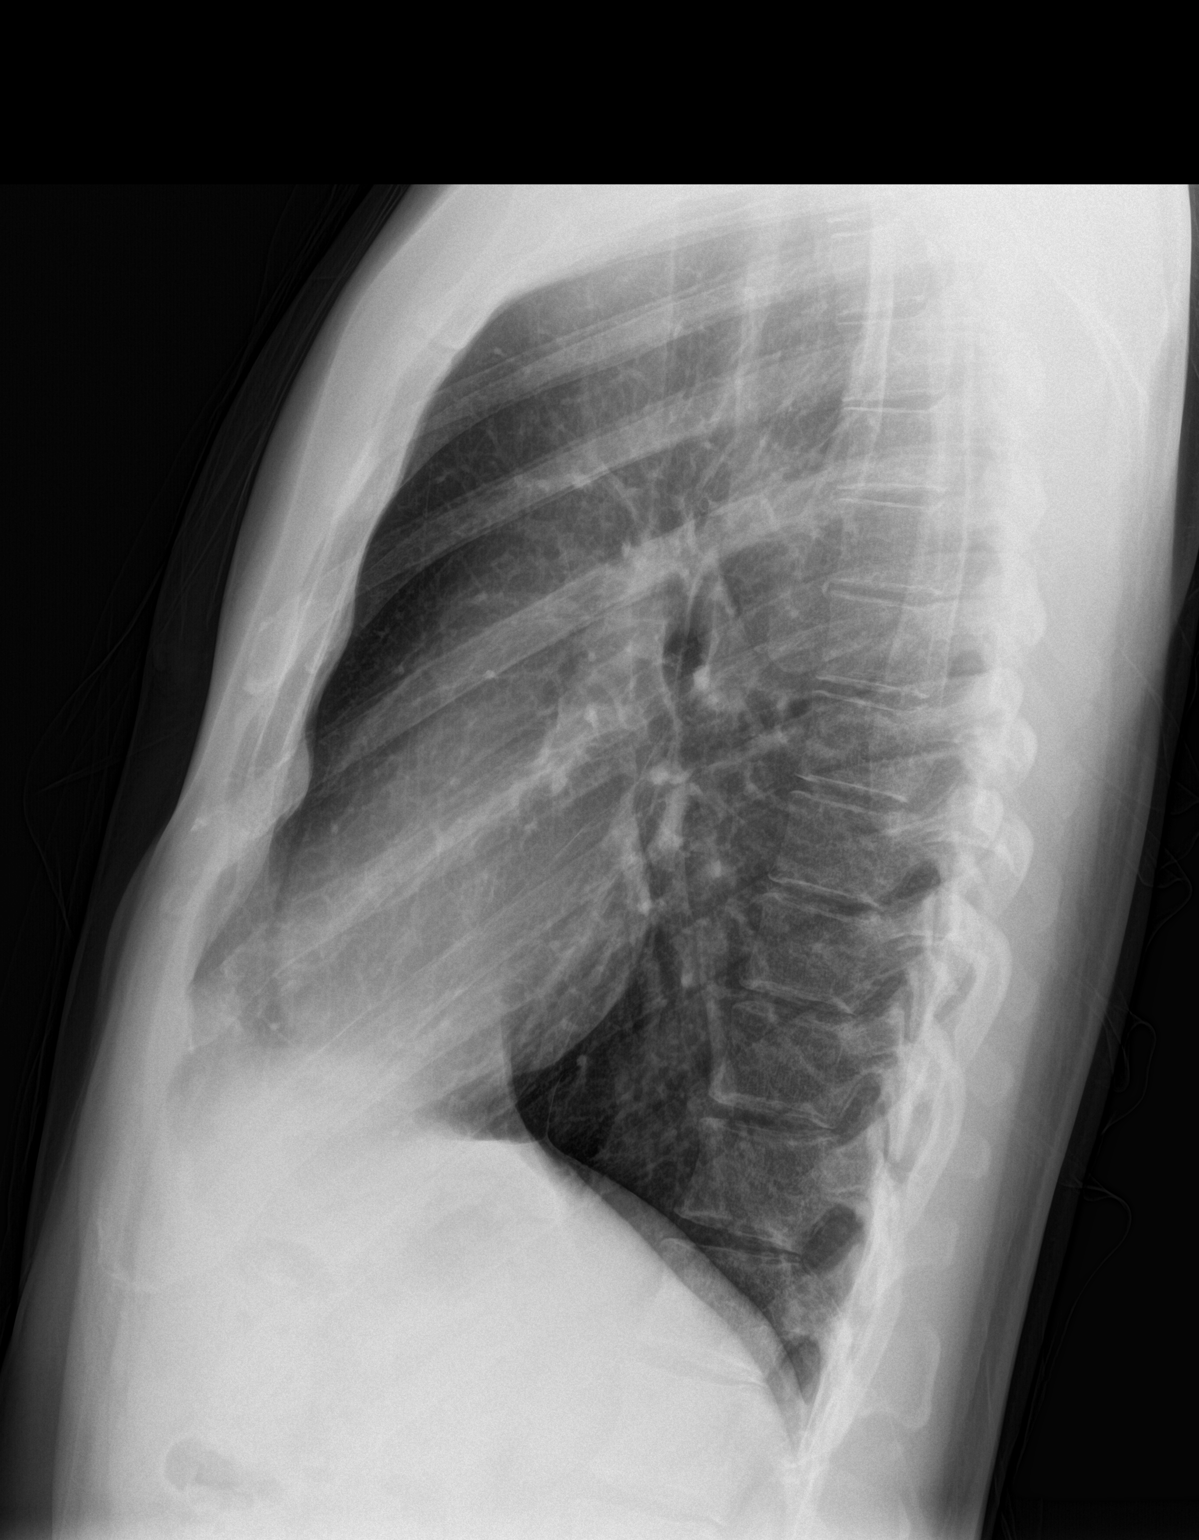

[2 of 2 positions shown; findings below may reference images not displayed]

FINDINGS: The lungs are well-aerated and clear. There is no evidence of focal
opacification, pleural effusion or pneumothorax.

The heart is borderline normal in size; the mediastinal contour is
within normal limits. No acute osseous abnormalities are seen.
IMPRESSION: No acute cardiopulmonary process seen.

## 2016-05-24 ENCOUNTER — Ambulatory Visit (INDEPENDENT_AMBULATORY_CARE_PROVIDER_SITE_OTHER): Payer: 59 | Admitting: Family Medicine

## 2016-05-24 VITALS — BP 102/70 | HR 69 | Temp 97.8°F | Resp 16 | Ht 67.0 in | Wt 153.0 lb

## 2016-05-24 DIAGNOSIS — K219 Gastro-esophageal reflux disease without esophagitis: Secondary | ICD-10-CM | POA: Diagnosis not present

## 2016-05-24 DIAGNOSIS — F329 Major depressive disorder, single episode, unspecified: Secondary | ICD-10-CM

## 2016-05-24 DIAGNOSIS — F418 Other specified anxiety disorders: Secondary | ICD-10-CM

## 2016-05-24 DIAGNOSIS — R7989 Other specified abnormal findings of blood chemistry: Secondary | ICD-10-CM

## 2016-05-24 DIAGNOSIS — R945 Abnormal results of liver function studies: Secondary | ICD-10-CM

## 2016-05-24 DIAGNOSIS — Z114 Encounter for screening for human immunodeficiency virus [HIV]: Secondary | ICD-10-CM

## 2016-05-24 DIAGNOSIS — G4700 Insomnia, unspecified: Secondary | ICD-10-CM

## 2016-05-24 DIAGNOSIS — F419 Anxiety disorder, unspecified: Principal | ICD-10-CM

## 2016-05-24 MED ORDER — PANTOPRAZOLE SODIUM 40 MG PO TBEC: DELAYED_RELEASE_TABLET | ORAL | Status: DC

## 2016-05-24 MED ORDER — GABAPENTIN 300 MG PO CAPS: 300.0000 mg | ORAL_CAPSULE | Freq: Every day | ORAL | Status: AC

## 2016-05-24 MED ORDER — HYDROXYZINE HCL 25 MG PO TABS: 25.0000 mg | ORAL_TABLET | Freq: Three times a day (TID) | ORAL | Status: AC | PRN

## 2016-05-24 MED ORDER — FLUOXETINE HCL 20 MG PO TABS: 20.0000 mg | ORAL_TABLET | Freq: Every day | ORAL | Status: DC

## 2016-05-24 NOTE — Progress Notes (Signed)
Subjective:    Patient ID: Robert Wang, male    DOB: 01-06-1981, 35 y.o.   MRN: [id number]  05/24/2016  Heartburn; Anxiety; Depression; and Leg Pain   HPI This 35 y.o. male presents for fifteen month follow-up:  1. GERD: ran out of medication 11/2015; no motivation to return to office.  Excessive reflux qod at least; if stops taking medication, medication only occurs every 3 days.  If stops Zantac, then has a bad day but then gets better.  Has been taking Zantac 150mg  once daily.  Heartburn is random.  No n/v; no abdominal pain.  NO bloody stools or black stools.  No constipation or diarrhea.                                                        2.  Anxiety and depression: lacking motivation; no pleasure in anything. No SI.  Only time has pleasure is when drinking; tries to drink on Tuesday nights but does not want to drink; drinking is a problem next day and unable to do anything.  Drinks heavily on those days.  Last week did not drink.  Anxious; excessive worry about what can happen to pt, wife, son.  Worries about the car.  Same job; 11/2013.  Likes work.  Call center.  Easy work; good money; stressful.  Zoloft was not helpful. Celexa for one day; caused diarrhea. Liked Trazodone but caused crazy dreams.  Only sleeps six hours per day.  Girlfriend took Cymbalta and was not helpful; took for a few months.  No SI.  3.  B leg pain: waking up at night; burning and tearing pain; if girlfriend sits on leg, has severe pain.   Went to specialist who did an MRI of his back and gave him 6 months worth of pain medications. Did an MRI of his leg, and discovered he had a torn muscle. He was frustrated that the specialist mis-diagnosed him and had him addicted to pain pills, so he stopped paying the doctor and has about $13,000 worth of fees. Started taking anti-inflammatory medications, but still doesn't seem to help. The localized area is still numb to this day, and he has daily flare-ups of  burning.  Prescribed Gabapentin 100mg  1-2 qhs; no improvement.  Has had this ripping pain in the past; recurred two months ago.  Does not occur every week; occurs twice each week; the week before, 3 nights.  Last episode 4 days ago.   Review of Systems  Constitutional: Negative for fever, chills, diaphoresis, activity change, appetite change and fatigue.  Respiratory: Negative for cough and shortness of breath.   Cardiovascular: Negative for chest pain, palpitations and leg swelling.  Gastrointestinal: Negative for nausea, vomiting, abdominal pain and diarrhea.  Endocrine: Negative for cold intolerance, heat intolerance, polydipsia, polyphagia and polyuria.  Musculoskeletal: Positive for myalgias.  Skin: Negative for color change, rash and wound.  Neurological: Positive for numbness. Negative for dizziness, tremors, seizures, syncope, facial asymmetry, speech difficulty, weakness, light-headedness and headaches.  Psychiatric/Behavioral: Positive for sleep disturbance and dysphoric mood. Negative for suicidal ideas and self-injury. The patient is nervous/anxious.     Past Medical History  Diagnosis Date  . Anxiety   . Depression   . GERD (gastroesophageal reflux disease)    Past Surgical History  Procedure Laterality Date  .  Wrist surgery      34 YEARS OLD   No Known Allergies  Social History   Social History  . Marital Status: Single    Spouse Name: N/A  . Number of Children: N/A  . Years of Education: N/A   Occupational History  . [employer] SUPPORT    Social History Main Topics  . Smoking status: Former Smoker -- 1.50 packs/day for 15 years    Types: Cigarettes  . Smokeless tobacco: Not on file  . Alcohol Use: 0.0 oz/week    0 Standard drinks or equivalent per week     Comment: beer - one or twice a month  . Drug Use: No  . Sexual Activity: Not on file   Other Topics Concern  . 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      Tobacco: Vaping      Alcohol:   1/5  per week.      Drugs:  None      Exercise: none   Family History  Problem Relation Age of Onset  . Heart disease Mother 39    HEART ATTACK; CABG age 51.  Marland Kitchen Hypertension Father        Objective:    BP 102/70 mmHg  Pulse 69  Temp(Src) 97.8 F (36.6 C)  Resp 16  Ht  (1.702 m)  Wt 153 lb (69.4 kg)  BMI 23.96 kg/m2  SpO2 99% Physical Exam  Constitutional: He is oriented to person, place, and time. He appears well-developed and well-nourished. No distress.  HENT:  Head: Normocephalic and atraumatic.  Right Ear: External ear normal.  Left Ear: External ear normal.  Nose: Nose normal.  Mouth/Throat: Oropharynx is clear and moist.  Eyes: Conjunctivae and EOM are normal. Pupils are equal, round, and reactive to light.  Neck: Normal range of motion. Neck supple. Carotid bruit is not present. No thyromegaly present.  Cardiovascular: Normal rate, regular rhythm, normal heart sounds and intact distal pulses.  Exam reveals no gallop and no friction rub.   No murmur heard. Pulmonary/Chest: Effort normal and breath sounds normal. He has no wheezes. He has no rales.  Abdominal: Soft. Bowel sounds are normal. He exhibits no distension and no mass. There is no tenderness. There is no rebound and no guarding.  Musculoskeletal:       Lumbar back: Normal. He exhibits normal range of motion, no tenderness, no bony tenderness, no pain and no spasm.  Lymphadenopathy:    He has no cervical adenopathy.  Neurological: He is alert and oriented to person, place, and time. No cranial nerve deficit.  Skin: Skin is warm and dry. No rash noted. He is not diaphoretic.  Psychiatric: He has a normal mood and affect. His behavior is normal.  Nursing note and vitals reviewed.       Assessment & Plan:   1. Anxiety and depression   2. Gastroesophageal reflux  disease without esophagitis   3. Insomnia   4. Elevated LFTs   5. Screening for HIV (human immunodeficiency virus)     Orders Placed This Encounter  Procedures  . Comprehensive metabolic panel  . HIV antibody   Meds ordered this encounter  Medications  . pantoprazole (PROTONIX) 40 MG tablet    Sig: Take  1 tablet every day    Dispense:  30 tablet    Refill:  11  . FLUoxetine (PROZAC) 20 MG tablet    Sig: Take 1 tablet (20 mg total) by mouth daily.    Dispense:  30 tablet    Refill:  5  . hydrOXYzine (ATARAX/VISTARIL) 25 MG tablet    Sig: Take 1 tablet (25 mg total) by mouth every 8 (eight) hours as needed for anxiety.    Dispense:  30 tablet    Refill:  0  . gabapentin (NEURONTIN) 300 MG capsule    Sig: Take 1 capsule (300 mg total) by mouth at bedtime.    Dispense:  30 capsule    Refill:  1    Return in about 6 weeks (around 07/05/2016) for recheck.    Deshae Dickison Paulita Fujita, M.D. [facility] [address] [place], [place]  [address] [phone] phone [phone] fax

## 2016-05-24 NOTE — Patient Instructions (Signed)
1.  Sports Medicine physicians:  Ayesha MohairZack Dejaun Vidrio, DO 2.  Orthopedist: Dr. Alcide GoodnessJosh Landeau [facility]    IF you received an x-ray today, you will receive an invoice from [facility]. Please contact [facility] at [phone] with questions or concerns regarding your invoice.   IF you received labwork today, you will receive an invoice from [facility]. Please contact [facility] at [phone] with questions or concerns regarding your invoice.   Our billing staff will not be able to assist you with questions regarding bills from these companies.  You will be contacted with the lab results as soon as they are available. The fastest way to get your results is to activate your My Chart account. Instructions are located on the last page of this paperwork. If you have not heard from us regarding the results in 2 weeks, please contact this office.

## 2016-05-25 LAB — COMPREHENSIVE METABOLIC PANEL
ALBUMIN: 4.6 g/dL (ref 3.6–5.1)
ALK PHOS: 56 U/L (ref 40–115)
ALT: 35 U/L (ref 9–46)
AST: 18 U/L (ref 10–40)
BILIRUBIN TOTAL: 0.5 mg/dL (ref 0.2–1.2)
BUN: 15 mg/dL (ref 7–25)
CALCIUM: 9.6 mg/dL (ref 8.6–10.3)
CO2: 26 mmol/L (ref 20–31)
Chloride: 103 mmol/L (ref 98–110)
Creat: 1.07 mg/dL (ref 0.60–1.35)
Glucose, Bld: 95 mg/dL (ref 65–99)
POTASSIUM: 4.3 mmol/L (ref 3.5–5.3)
Sodium: 140 mmol/L (ref 135–146)
Total Protein: 6.8 g/dL (ref 6.1–8.1)

## 2016-05-25 LAB — HIV ANTIBODY (ROUTINE TESTING W REFLEX): HIV 1&2 Ab, 4th Generation: NONREACTIVE

## 2016-06-11 ENCOUNTER — Encounter: Payer: Self-pay | Admitting: Family Medicine

## 2016-07-13 ENCOUNTER — Other Ambulatory Visit: Payer: Self-pay

## 2016-07-13 MED ORDER — PANTOPRAZOLE SODIUM 40 MG PO TBEC: DELAYED_RELEASE_TABLET | ORAL | Status: DC

## 2016-07-13 MED ORDER — FLUOXETINE HCL 20 MG PO TABS: 20.0000 mg | ORAL_TABLET | Freq: Every day | ORAL | Status: DC

## 2016-07-28 ENCOUNTER — Telehealth: Payer: Self-pay

## 2016-07-28 NOTE — Telephone Encounter (Signed)
Patient is requesting medication refills on his medications Prozac and heart burn medication.  [facility] [address] [phone] (H)

## 2016-07-30 ENCOUNTER — Ambulatory Visit: Payer: 59

## 2016-07-30 MED ORDER — FLUOXETINE HCL 20 MG PO TABS: 20.0000 mg | ORAL_TABLET | Freq: Every day | ORAL | 1 refills | Status: AC

## 2016-07-30 MED ORDER — PANTOPRAZOLE SODIUM 40 MG PO TBEC: DELAYED_RELEASE_TABLET | ORAL | 2 refills | Status: AC

## 2016-07-30 NOTE — Telephone Encounter (Signed)
Rx sent 

## 2017-07-21 ENCOUNTER — Other Ambulatory Visit: Payer: Self-pay | Admitting: Family Medicine

## 2018-05-08 ENCOUNTER — Ambulatory Visit (HOSPITAL_COMMUNITY): Payer: 59 | Admitting: Psychiatry

## 2018-05-09 ENCOUNTER — Encounter: Payer: Self-pay | Admitting: Family Medicine

## 2018-05-14 ENCOUNTER — Encounter: Payer: Self-pay | Admitting: Family Medicine

## 2018-10-12 ENCOUNTER — Emergency Department (HOSPITAL_COMMUNITY): Payer: 59

## 2018-10-12 ENCOUNTER — Encounter (HOSPITAL_COMMUNITY): Payer: Self-pay

## 2018-10-12 ENCOUNTER — Emergency Department (HOSPITAL_COMMUNITY)
Admission: EM | Admit: 2018-10-12 | Discharge: 2018-10-13 | Disposition: A | Discharge status: Home / Self Care | Payer: 59 | Attending: Emergency Medicine | Admitting: Emergency Medicine

## 2018-10-12 DIAGNOSIS — Z79899 Other long term (current) drug therapy: Secondary | ICD-10-CM | POA: Insufficient documentation

## 2018-10-12 DIAGNOSIS — M542 Cervicalgia: Secondary | ICD-10-CM | POA: Diagnosis not present

## 2018-10-12 DIAGNOSIS — Z87891 Personal history of nicotine dependence: Secondary | ICD-10-CM | POA: Diagnosis not present

## 2018-10-12 MED ORDER — HYDROCODONE-ACETAMINOPHEN 5-325 MG PO TABS: 1.0000 | ORAL_TABLET | Freq: Once | ORAL | Status: DC

## 2018-10-12 MED ORDER — METHOCARBAMOL 500 MG PO TABS: 500.0000 mg | ORAL_TABLET | Freq: Two times a day (BID) | ORAL | 0 refills | Status: AC

## 2018-10-12 MED ORDER — NAPROXEN 500 MG PO TABS: 500.0000 mg | ORAL_TABLET | Freq: Two times a day (BID) | ORAL | 0 refills | Status: AC
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
## 2018-10-13 NOTE — ED Notes (Signed)
Patient verbalizes understanding of discharge instructions. Opportunity for questioning and answers were provided. Armband removed by staff, pt discharged from ED home via POV.  

## 2018-12-10 ENCOUNTER — Ambulatory Visit: Payer: 59 | Admitting: Emergency Medicine

## 2019-05-08 ENCOUNTER — Ambulatory Visit: Payer: 59 | Admitting: Registered Nurse

## 2023-10-28 ENCOUNTER — Ambulatory Visit: Payer: Medicaid Other | Admitting: Dermatology

## 2024-12-04 ENCOUNTER — Emergency Department (HOSPITAL_COMMUNITY)
Admission: EM | Admit: 2024-12-04 | Discharge: 2024-12-04 | Discharge status: Against Medical Advice | Attending: Emergency Medicine | Admitting: Emergency Medicine

## 2024-12-04 ENCOUNTER — Encounter (HOSPITAL_COMMUNITY): Payer: Self-pay | Admitting: Emergency Medicine

## 2024-12-04 DIAGNOSIS — Z5321 Procedure and treatment not carried out due to patient leaving prior to being seen by health care provider: Secondary | ICD-10-CM | POA: Insufficient documentation

## 2024-12-04 DIAGNOSIS — M545 Low back pain, unspecified: Secondary | ICD-10-CM | POA: Insufficient documentation

## 2024-12-04 DIAGNOSIS — X500XXA Overexertion from strenuous movement or load, initial encounter: Secondary | ICD-10-CM | POA: Insufficient documentation

## 2024-12-04 NOTE — ED Triage Notes (Signed)
 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 Woke up today with lumbar pain and numbness radiating down both legs. Denies fever, IV drug use, or loss of bowel or bladder. Took OTC ibuprofen without relief.

## 2024-12-04 NOTE — ED Notes (Signed)
 Patient has left about 2hrs ago.
